# Patient Record
Sex: Female | Born: 1950 | Race: White | Hispanic: No | Marital: Married | Smoking: Never smoker
Health system: Southern US, Community
[De-identification: ages and names within clinical notes are randomized; demographics above are authoritative.]

## PROBLEM LIST (undated history)

## (undated) DIAGNOSIS — E7800 Pure hypercholesterolemia, unspecified: Secondary | ICD-10-CM

## (undated) DIAGNOSIS — IMO0002 Reserved for concepts with insufficient information to code with codable children: Secondary | ICD-10-CM

## (undated) HISTORY — DX: Reserved for concepts with insufficient information to code with codable children: IMO0002

## (undated) HISTORY — PX: OTHER SURGICAL HISTORY: SHX169

## (undated) HISTORY — DX: Pure hypercholesterolemia, unspecified: E78.00

## (undated) HISTORY — PX: CERVICAL CONE BIOPSY: SUR198

## (undated) HISTORY — PX: WISDOM TOOTH EXTRACTION: SHX21

---

## 1999-01-01 ENCOUNTER — Other Ambulatory Visit: Admission: RE | Admit: 1999-01-01 | Discharge: 1999-01-01 | Payer: Self-pay | Admitting: Obstetrics & Gynecology

## 2000-10-15 ENCOUNTER — Other Ambulatory Visit: Admission: RE | Admit: 2000-10-15 | Discharge: 2000-10-15 | Payer: Self-pay | Admitting: Obstetrics & Gynecology

## 2001-11-14 ENCOUNTER — Other Ambulatory Visit: Admission: RE | Admit: 2001-11-14 | Discharge: 2001-11-14 | Payer: Self-pay | Admitting: Obstetrics & Gynecology

## 2002-12-21 ENCOUNTER — Other Ambulatory Visit: Admission: RE | Admit: 2002-12-21 | Discharge: 2002-12-21 | Payer: Self-pay | Admitting: Obstetrics & Gynecology

## 2004-01-15 ENCOUNTER — Other Ambulatory Visit: Admission: RE | Admit: 2004-01-15 | Discharge: 2004-01-15 | Payer: Self-pay | Admitting: Obstetrics & Gynecology

## 2004-02-01 ENCOUNTER — Encounter (INDEPENDENT_AMBULATORY_CARE_PROVIDER_SITE_OTHER): Payer: Self-pay | Admitting: Specialist

## 2004-02-01 ENCOUNTER — Ambulatory Visit (HOSPITAL_COMMUNITY): Admission: RE | Admit: 2004-02-01 | Discharge: 2004-02-01 | Payer: Self-pay | Admitting: Gastroenterology

## 2005-08-30 DIAGNOSIS — R87619 Unspecified abnormal cytological findings in specimens from cervix uteri: Secondary | ICD-10-CM

## 2005-08-30 DIAGNOSIS — IMO0002 Reserved for concepts with insufficient information to code with codable children: Secondary | ICD-10-CM

## 2005-08-30 HISTORY — DX: Unspecified abnormal cytological findings in specimens from cervix uteri: R87.619

## 2005-08-30 HISTORY — DX: Reserved for concepts with insufficient information to code with codable children: IMO0002

## 2005-09-24 ENCOUNTER — Other Ambulatory Visit: Admission: RE | Admit: 2005-09-24 | Discharge: 2005-09-24 | Payer: Self-pay | Admitting: Obstetrics & Gynecology

## 2005-12-18 ENCOUNTER — Other Ambulatory Visit: Admission: RE | Admit: 2005-12-18 | Discharge: 2005-12-18 | Payer: Self-pay | Admitting: Obstetrics & Gynecology

## 2007-07-14 ENCOUNTER — Encounter: Admission: RE | Admit: 2007-07-14 | Discharge: 2007-07-14 | Payer: Self-pay | Admitting: Obstetrics & Gynecology

## 2010-12-21 ENCOUNTER — Encounter: Payer: Self-pay | Admitting: Obstetrics & Gynecology

## 2011-12-07 ENCOUNTER — Ambulatory Visit: Payer: Managed Care, Other (non HMO) | Admitting: Family Medicine

## 2011-12-07 DIAGNOSIS — M810 Age-related osteoporosis without current pathological fracture: Secondary | ICD-10-CM

## 2011-12-30 ENCOUNTER — Telehealth: Payer: Self-pay

## 2011-12-30 NOTE — Telephone Encounter (Signed)
Patient would like for Dr. Hal Hope to mail her a rx for Fosamox she states that she only has a rx for 4wks worth of medication and her insurance would like her to use the mail order to get her medications sent to her. Please contact her if any questions.

## 2011-12-31 MED ORDER — ALENDRONATE SODIUM 70 MG PO TABS: 70.0000 mg | ORAL_TABLET | ORAL | Status: DC

## 2011-12-31 NOTE — Telephone Encounter (Signed)
Done. Please mail to pat.  Cheyenne Haley

## 2012-01-01 NOTE — Telephone Encounter (Signed)
LMOM that request has been completed and mailed.

## 2012-01-06 ENCOUNTER — Other Ambulatory Visit: Payer: Self-pay | Admitting: Family Medicine

## 2012-02-16 ENCOUNTER — Telehealth: Payer: Self-pay

## 2012-02-16 MED ORDER — ALENDRONATE SODIUM 70 MG PO TABS: 70.0000 mg | ORAL_TABLET | ORAL | Status: AC

## 2012-02-16 NOTE — Telephone Encounter (Signed)
Please advise patient that new rx for 90-day supply was sent to [facility].  She should contact them to verify they received it!

## 2012-02-16 NOTE — Telephone Encounter (Signed)
alendronate (FOSAMAX) 70 MG tablet  Pt is requesting a 90 day supply from [facility].  [facility] stated the patient had to contact us as they could not submit the current script of 28 days as a 90 day refill [facility] [phone]  PT ID at [facility] IS [id number]  PT Spelled the medication as AINDRONATE SODIUM TABLES 70

## 2012-02-16 NOTE — Telephone Encounter (Signed)
Called patient. Left her a message about the new script going to [facility].

## 2012-03-15 ENCOUNTER — Encounter: Payer: PRIVATE HEALTH INSURANCE | Admitting: Obstetrics and Gynecology

## 2012-03-30 ENCOUNTER — Encounter: Payer: PRIVATE HEALTH INSURANCE | Admitting: Obstetrics and Gynecology

## 2012-04-13 ENCOUNTER — Encounter: Payer: PRIVATE HEALTH INSURANCE | Admitting: Obstetrics and Gynecology

## 2012-05-04 ENCOUNTER — Encounter: Payer: PRIVATE HEALTH INSURANCE | Admitting: Obstetrics and Gynecology

## 2012-06-06 ENCOUNTER — Encounter: Payer: Self-pay | Admitting: Obstetrics and Gynecology

## 2012-06-06 ENCOUNTER — Ambulatory Visit (INDEPENDENT_AMBULATORY_CARE_PROVIDER_SITE_OTHER): Payer: Commercial Indemnity | Admitting: Obstetrics and Gynecology

## 2012-06-06 VITALS — BP 120/78 | Ht 65.0 in | Wt 141.0 lb

## 2012-06-06 DIAGNOSIS — Z01419 Encounter for gynecological examination (general) (routine) without abnormal findings: Secondary | ICD-10-CM

## 2012-06-06 DIAGNOSIS — Z124 Encounter for screening for malignant neoplasm of cervix: Secondary | ICD-10-CM

## 2012-06-06 DIAGNOSIS — M8580 Other specified disorders of bone density and structure, unspecified site: Secondary | ICD-10-CM

## 2012-06-06 DIAGNOSIS — M899 Disorder of bone, unspecified: Secondary | ICD-10-CM

## 2012-06-06 NOTE — Patient Instructions (Signed)
Calcium requirements sheet

## 2012-06-06 NOTE — Progress Notes (Signed)
The patient is not taking hormone replacement therapy The patient  is taking a Calcium supplement. Post-menopausal bleeding:no  Last Pap: was normal February   2012 Last mammogram: was normal February  2012 Last DEXA scan : T= -2.0    September  2012 Using Fosamax 70 mg weekly for 4-5 years, took a 2 year break and restarted 02/2012 Last colonoscopy:Normal 2 years ago per pt   Urinary symptoms: none Normal bowel movements: Yes Reports abuse at home: No  Subjective:    Cheyenne Haley is a 61 y.o. female G0P0 who presents for annual exam. Referred by Dr Hal Hope The patient has no complaints today.   The following portions of the patient's history were reviewed and updated as appropriate: allergies, current medications, past family history, past medical history, past social history, past surgical history and problem list.  Review of Systems Pertinent items are noted in HPI. Gastrointestinal:No change in bowel habits, no abdominal pain, no rectal bleeding Genitourinary:negative for dysuria, frequency, hematuria, nocturia and urinary incontinence    Objective:     BP 120/78  Ht 5\' 5"  (1.651 m)  Wt 141 lb (63.957 kg)  BMI 23.46 kg/m2  Weight:  Wt Readings from Last 1 Encounters:  06/06/12 141 lb (63.957 kg)     BMI: Body mass index is 23.46 kg/(m^2). General Appearance: Alert, appropriate appearance for age. No acute distress HEENT: Grossly normal Neck / Thyroid: Supple, no masses, nodes or enlargement Lungs: clear to auscultation bilaterally Back: No CVA tenderness Breast Exam: No masses or nodes.No dimpling, nipple retraction or discharge. Cardiovascular: Regular rate and rhythm. S1, S2, no murmur Gastrointestinal: Soft, non-tender, no masses or organomegaly Pelvic Exam: Vulva and vagina appear normal. Bimanual exam reveals normal uterus and adnexa. Rectovaginal: normal rectal, no masses Lymphatic Exam: Non-palpable nodes in neck, clavicular, axillary, or inguinal regions  Skin: no rash or abnormalities Neurologic: Normal gait and speech, no tremor  Psychiatric: Alert and oriented, appropriate affect.         Assessment:    Normal gyn exam Osteopenia  Vaginal dryness   Plan:     Suggest D/C Fosamax, optimize Calcium to 1500 mg daily, check Vitamin D level, increase weight-bearing exercise to 3x weekly Pap Mammogram DEXA next year HRT reviewed with [facility] and [facility] Follow-up:  for annual exam

## 2012-06-07 LAB — PAP IG W/ RFLX HPV ASCU

## 2012-06-07 LAB — VITAMIN D 25 HYDROXY (VIT D DEFICIENCY, FRACTURES): Vit D, 25-Hydroxy: 40 ng/mL (ref 30–89)

## 2012-08-04 ENCOUNTER — Telehealth: Payer: Self-pay | Admitting: Obstetrics and Gynecology

## 2012-08-04 NOTE — Telephone Encounter (Signed)
LAURA/ RES

## 2012-08-04 NOTE — Telephone Encounter (Signed)
LM for pt that Vit D level was normal at 40 and Dr Dorothyann Peng was mentioned as PCP referral.  ld

## 2012-11-21 ENCOUNTER — Telehealth: Payer: Self-pay

## 2012-11-21 DIAGNOSIS — Z139 Encounter for screening, unspecified: Secondary | ICD-10-CM

## 2012-11-21 DIAGNOSIS — Z1231 Encounter for screening mammogram for malignant neoplasm of breast: Secondary | ICD-10-CM

## 2012-11-21 NOTE — Telephone Encounter (Signed)
VM from pt regarding mmg in our office advised pt that was not going to be until next year. Pt states that she is needing mmg. Advised pt of [facility] phone number to schedule and will put in order for mmg.   Darien Ramus, CMA

## 2012-12-22 ENCOUNTER — Telehealth: Payer: Self-pay | Admitting: Obstetrics and Gynecology

## 2012-12-22 NOTE — Telephone Encounter (Signed)
  TC to pt.  States Dr Lelon Perla is not accepting new pts.  Per HD, informed will need to research PCP who accepts insurance to locate MD.   Pt agreeable.

## 2012-12-29 ENCOUNTER — Ambulatory Visit
Admission: RE | Admit: 2012-12-29 | Discharge: 2012-12-29 | Disposition: A | Discharge status: Home / Self Care | Payer: Commercial Indemnity | Source: Ambulatory Visit | Attending: Obstetrics and Gynecology | Admitting: Obstetrics and Gynecology

## 2012-12-29 DIAGNOSIS — Z1231 Encounter for screening mammogram for malignant neoplasm of breast: Secondary | ICD-10-CM

## 2014-01-30 ENCOUNTER — Other Ambulatory Visit: Payer: Self-pay

## 2014-01-30 DIAGNOSIS — Z1231 Encounter for screening mammogram for malignant neoplasm of breast: Secondary | ICD-10-CM

## 2014-02-13 ENCOUNTER — Ambulatory Visit: Payer: Commercial Indemnity

## 2014-02-19 ENCOUNTER — Ambulatory Visit
Admission: RE | Admit: 2014-02-19 | Discharge: 2014-02-19 | Disposition: A | Discharge status: Home / Self Care | Payer: Self-pay | Source: Ambulatory Visit

## 2014-02-19 DIAGNOSIS — Z1231 Encounter for screening mammogram for malignant neoplasm of breast: Secondary | ICD-10-CM

## 2014-02-22 ENCOUNTER — Other Ambulatory Visit: Payer: Self-pay | Admitting: Obstetrics and Gynecology

## 2014-02-22 DIAGNOSIS — R928 Other abnormal and inconclusive findings on diagnostic imaging of breast: Secondary | ICD-10-CM

## 2014-02-28 ENCOUNTER — Ambulatory Visit
Admission: RE | Admit: 2014-02-28 | Discharge: 2014-02-28 | Disposition: A | Discharge status: Home / Self Care | Payer: Commercial Indemnity | Source: Ambulatory Visit | Attending: Obstetrics and Gynecology | Admitting: Obstetrics and Gynecology

## 2014-02-28 DIAGNOSIS — R928 Other abnormal and inconclusive findings on diagnostic imaging of breast: Secondary | ICD-10-CM

## 2015-04-03 ENCOUNTER — Other Ambulatory Visit: Payer: Self-pay

## 2015-04-03 DIAGNOSIS — Z1231 Encounter for screening mammogram for malignant neoplasm of breast: Secondary | ICD-10-CM

## 2015-04-16 ENCOUNTER — Ambulatory Visit
Admission: RE | Admit: 2015-04-16 | Discharge: 2015-04-16 | Disposition: A | Discharge status: Home / Self Care | Payer: BLUE CROSS/BLUE SHIELD | Source: Ambulatory Visit

## 2015-04-16 DIAGNOSIS — Z1231 Encounter for screening mammogram for malignant neoplasm of breast: Secondary | ICD-10-CM

## 2016-06-22 ENCOUNTER — Other Ambulatory Visit: Payer: Self-pay | Admitting: Obstetrics and Gynecology

## 2016-06-22 DIAGNOSIS — Z1231 Encounter for screening mammogram for malignant neoplasm of breast: Secondary | ICD-10-CM

## 2016-07-01 ENCOUNTER — Ambulatory Visit
Admission: RE | Admit: 2016-07-01 | Discharge: 2016-07-01 | Disposition: A | Discharge status: Home / Self Care | Payer: BLUE CROSS/BLUE SHIELD | Source: Ambulatory Visit | Attending: Obstetrics and Gynecology | Admitting: Obstetrics and Gynecology

## 2016-07-01 DIAGNOSIS — Z1231 Encounter for screening mammogram for malignant neoplasm of breast: Secondary | ICD-10-CM

## 2018-09-14 ENCOUNTER — Ambulatory Visit (INDEPENDENT_AMBULATORY_CARE_PROVIDER_SITE_OTHER): Payer: Medicare Other

## 2018-09-14 ENCOUNTER — Other Ambulatory Visit: Payer: Self-pay | Admitting: Podiatry

## 2018-09-14 ENCOUNTER — Ambulatory Visit: Payer: Medicare Other | Admitting: Podiatry

## 2018-09-14 ENCOUNTER — Encounter: Payer: Self-pay | Admitting: Podiatry

## 2018-09-14 VITALS — BP 122/73 | HR 73 | Resp 16

## 2018-09-14 DIAGNOSIS — M722 Plantar fascial fibromatosis: Secondary | ICD-10-CM

## 2018-09-14 DIAGNOSIS — M79671 Pain in right foot: Secondary | ICD-10-CM

## 2018-09-14 DIAGNOSIS — E663 Overweight: Secondary | ICD-10-CM | POA: Insufficient documentation

## 2018-09-14 MED ORDER — TRIAMCINOLONE ACETONIDE 10 MG/ML IJ SUSP
10.0000 mg | Freq: Once | INTRAMUSCULAR | Status: AC
  Administered 2018-09-14: 10 mg

## 2018-09-14 NOTE — Patient Instructions (Signed)

## 2018-09-14 NOTE — Progress Notes (Signed)
Subjective:   Patient ID: Cheyenne Haley, female   DOB: 67 y.o.   MRN: [id number]   HPI Patient presents stating the bottom my right heel is really been hurting me for the last month and I do not remember specific injury or why this made like this.  Patient is worse when she gets up in the morning after periods of sitting and patient does not smoke and likes to be active   Review of Systems  All other systems reviewed and are negative.       Objective:  Physical Exam  Constitutional: She appears well-developed and well-nourished.  Cardiovascular: Intact distal pulses.  Pulmonary/Chest: Effort normal.  Musculoskeletal: Normal range of motion.  Neurological: She is alert.  Skin: Skin is warm.  Nursing note and vitals reviewed.   Neurovascular status intact muscle strength is adequate range of motion within normal limits with patient found to have exquisite discomfort plantar aspect right heel at the insertional point of the tendon into the calcaneus with inflammation and fluid around the medial band.  Patient's found to have good digital perfusion and is found to be well oriented     Assessment:  Acute plantar fasciitis right with inflammation fluid of the medial band     Plan:  H&P x-ray reviewed and today I injected the plantar fascia 3 mg Kenalog 5 Milgram Xylocaine applied fascial brace with instructions on usage and instructed on supportive shoes and higher heeled type shoes.  Patient will be seen back to recheck in the next several weeks  X-ray indicates small spur with no indications of stress fracture or arthritis

## 2018-09-14 NOTE — Progress Notes (Signed)
   Subjective:    Patient ID: Cheyenne Haley, female    DOB: 13-Jan-1951, 67 y.o.   MRN: [id number]  HPI    Review of Systems  All other systems reviewed and are negative.      Objective:   Physical Exam        Assessment & Plan:

## 2019-01-20 ENCOUNTER — Telehealth: Payer: Self-pay | Admitting: *Deleted

## 2019-01-20 NOTE — Telephone Encounter (Signed)
Pt states she would like her medical records faxed to [facility], she saw Dr. Charlsie Merles in October and had x-rays.

## 2019-01-25 NOTE — Telephone Encounter (Signed)
I returned pt's phone call about her requesting her medical records be sent to [facility]. I told the pt she would need to fill out and sign a medical records release form. Pt requested the form be e-mailed to her at [email or web address].

## 2019-12-24 ENCOUNTER — Ambulatory Visit: Payer: Medicare Other | Attending: Internal Medicine

## 2019-12-24 DIAGNOSIS — Z23 Encounter for immunization: Secondary | ICD-10-CM

## 2019-12-24 NOTE — Progress Notes (Signed)
   Covid-19 Vaccination Clinic  Name:  Cheyenne Haley    MRN: [id number] DOB: 02/21/1951  12/24/2019  [name] was observed post Covid-19 immunization for 15 minutes without incidence. She was provided with Vaccine Information Sheet and instruction to access the V-Safe system.   [name] was instructed to call 911 with any severe reactions post vaccine: Marland Kitchen Difficulty breathing  . Swelling of your face and throat  . A fast heartbeat  . A bad rash all over your body  . Dizziness and weakness    Immunizations Administered    Name Date Dose VIS Date Route   Pfizer COVID-19 Vaccine 12/24/2019 11:02 AM 0.3 mL 11/10/2019 Intramuscular   Manufacturer: [employer], [employer]   Lot: [id number]   NDC: [address]-5

## 2020-01-15 ENCOUNTER — Ambulatory Visit: Payer: Medicare Other | Attending: Internal Medicine

## 2020-01-15 DIAGNOSIS — Z23 Encounter for immunization: Secondary | ICD-10-CM

## 2020-01-15 NOTE — Progress Notes (Signed)
   Covid-19 Vaccination Clinic  Name:  Cheyenne Haley    MRN: [id number] DOB: 20-Aug-1951  01/15/2020  [name] was observed post Covid-19 immunization for 15 minutes without incidence. She was provided with Vaccine Information Sheet and instruction to access the V-Safe system.   [name] was instructed to call 911 with any severe reactions post vaccine: Marland Kitchen Difficulty breathing  . Swelling of your face and throat  . A fast heartbeat  . A bad rash all over your body  . Dizziness and weakness    Immunizations Administered    Name Date Dose VIS Date Route   Pfizer COVID-19 Vaccine 01/15/2020 10:41 AM 0.3 mL 11/10/2019 Intramuscular   Manufacturer: [employer], [employer]   Lot: [id number]   NDC: [address]-3

## 2020-12-19 DIAGNOSIS — H524 Presbyopia: Secondary | ICD-10-CM | POA: Diagnosis not present

## 2020-12-19 DIAGNOSIS — H5203 Hypermetropia, bilateral: Secondary | ICD-10-CM | POA: Diagnosis not present

## 2020-12-19 DIAGNOSIS — H2513 Age-related nuclear cataract, bilateral: Secondary | ICD-10-CM | POA: Diagnosis not present

## 2021-07-04 DIAGNOSIS — E559 Vitamin D deficiency, unspecified: Secondary | ICD-10-CM | POA: Diagnosis not present

## 2021-07-04 DIAGNOSIS — G4762 Sleep related leg cramps: Secondary | ICD-10-CM | POA: Diagnosis not present

## 2021-07-04 DIAGNOSIS — E7800 Pure hypercholesterolemia, unspecified: Secondary | ICD-10-CM | POA: Diagnosis not present

## 2021-07-04 DIAGNOSIS — Z0000 Encounter for general adult medical examination without abnormal findings: Secondary | ICD-10-CM | POA: Diagnosis not present

## 2021-07-04 DIAGNOSIS — M5416 Radiculopathy, lumbar region: Secondary | ICD-10-CM | POA: Diagnosis not present

## 2021-07-04 DIAGNOSIS — Z1211 Encounter for screening for malignant neoplasm of colon: Secondary | ICD-10-CM | POA: Diagnosis not present

## 2021-11-18 DIAGNOSIS — M810 Age-related osteoporosis without current pathological fracture: Secondary | ICD-10-CM | POA: Diagnosis not present

## 2021-11-18 DIAGNOSIS — M5432 Sciatica, left side: Secondary | ICD-10-CM | POA: Diagnosis not present

## 2021-11-18 DIAGNOSIS — Z1231 Encounter for screening mammogram for malignant neoplasm of breast: Secondary | ICD-10-CM | POA: Diagnosis not present

## 2021-11-18 DIAGNOSIS — Z6823 Body mass index (BMI) 23.0-23.9, adult: Secondary | ICD-10-CM | POA: Diagnosis not present

## 2021-12-03 ENCOUNTER — Other Ambulatory Visit: Payer: Self-pay | Admitting: Obstetrics

## 2021-12-03 DIAGNOSIS — M810 Age-related osteoporosis without current pathological fracture: Secondary | ICD-10-CM

## 2022-04-30 ENCOUNTER — Ambulatory Visit
Admission: RE | Admit: 2022-04-30 | Discharge: 2022-04-30 | Disposition: A | Discharge status: Home / Self Care | Payer: Medicare Other | Source: Ambulatory Visit | Attending: Obstetrics | Admitting: Obstetrics

## 2022-04-30 DIAGNOSIS — Z780 Asymptomatic menopausal state: Secondary | ICD-10-CM | POA: Diagnosis not present

## 2022-04-30 DIAGNOSIS — M810 Age-related osteoporosis without current pathological fracture: Secondary | ICD-10-CM

## 2022-04-30 DIAGNOSIS — M8588 Other specified disorders of bone density and structure, other site: Secondary | ICD-10-CM | POA: Diagnosis not present

## 2022-07-17 DIAGNOSIS — E559 Vitamin D deficiency, unspecified: Secondary | ICD-10-CM | POA: Diagnosis not present

## 2022-07-17 DIAGNOSIS — Z1211 Encounter for screening for malignant neoplasm of colon: Secondary | ICD-10-CM | POA: Diagnosis not present

## 2022-07-17 DIAGNOSIS — G4762 Sleep related leg cramps: Secondary | ICD-10-CM | POA: Diagnosis not present

## 2022-07-17 DIAGNOSIS — M5416 Radiculopathy, lumbar region: Secondary | ICD-10-CM | POA: Diagnosis not present

## 2022-07-17 DIAGNOSIS — E7800 Pure hypercholesterolemia, unspecified: Secondary | ICD-10-CM | POA: Diagnosis not present

## 2022-07-17 DIAGNOSIS — Z0000 Encounter for general adult medical examination without abnormal findings: Secondary | ICD-10-CM | POA: Diagnosis not present

## 2022-07-24 ENCOUNTER — Other Ambulatory Visit: Payer: Self-pay | Admitting: Internal Medicine

## 2022-07-24 DIAGNOSIS — E785 Hyperlipidemia, unspecified: Secondary | ICD-10-CM

## 2022-07-29 ENCOUNTER — Ambulatory Visit
Admission: RE | Admit: 2022-07-29 | Discharge: 2022-07-29 | Disposition: A | Discharge status: Home / Self Care | Payer: Medicare Other | Source: Ambulatory Visit | Attending: Internal Medicine | Admitting: Internal Medicine

## 2022-07-29 DIAGNOSIS — E785 Hyperlipidemia, unspecified: Secondary | ICD-10-CM

## 2022-10-13 ENCOUNTER — Other Ambulatory Visit: Payer: Medicare Other

## 2023-02-04 DIAGNOSIS — Z1231 Encounter for screening mammogram for malignant neoplasm of breast: Secondary | ICD-10-CM | POA: Diagnosis not present

## 2023-02-04 DIAGNOSIS — M810 Age-related osteoporosis without current pathological fracture: Secondary | ICD-10-CM | POA: Diagnosis not present

## 2023-02-04 DIAGNOSIS — Z6824 Body mass index (BMI) 24.0-24.9, adult: Secondary | ICD-10-CM | POA: Diagnosis not present

## 2023-03-17 DIAGNOSIS — H5203 Hypermetropia, bilateral: Secondary | ICD-10-CM | POA: Diagnosis not present

## 2023-03-17 DIAGNOSIS — H2513 Age-related nuclear cataract, bilateral: Secondary | ICD-10-CM | POA: Diagnosis not present

## 2023-03-17 DIAGNOSIS — H524 Presbyopia: Secondary | ICD-10-CM | POA: Diagnosis not present

## 2023-07-22 DIAGNOSIS — L84 Corns and callosities: Secondary | ICD-10-CM | POA: Diagnosis not present

## 2023-07-22 DIAGNOSIS — G4762 Sleep related leg cramps: Secondary | ICD-10-CM | POA: Diagnosis not present

## 2023-07-22 DIAGNOSIS — M859 Disorder of bone density and structure, unspecified: Secondary | ICD-10-CM | POA: Diagnosis not present

## 2023-07-22 DIAGNOSIS — Z0000 Encounter for general adult medical examination without abnormal findings: Secondary | ICD-10-CM | POA: Diagnosis not present

## 2023-07-22 DIAGNOSIS — E559 Vitamin D deficiency, unspecified: Secondary | ICD-10-CM | POA: Diagnosis not present

## 2023-07-22 DIAGNOSIS — E7800 Pure hypercholesterolemia, unspecified: Secondary | ICD-10-CM | POA: Diagnosis not present

## 2023-07-22 DIAGNOSIS — K635 Polyp of colon: Secondary | ICD-10-CM | POA: Diagnosis not present

## 2023-07-22 DIAGNOSIS — K5730 Diverticulosis of large intestine without perforation or abscess without bleeding: Secondary | ICD-10-CM | POA: Diagnosis not present

## 2023-10-27 DIAGNOSIS — K5730 Diverticulosis of large intestine without perforation or abscess without bleeding: Secondary | ICD-10-CM | POA: Diagnosis not present

## 2023-10-27 DIAGNOSIS — D122 Benign neoplasm of ascending colon: Secondary | ICD-10-CM | POA: Diagnosis not present

## 2023-10-27 DIAGNOSIS — Z09 Encounter for follow-up examination after completed treatment for conditions other than malignant neoplasm: Secondary | ICD-10-CM | POA: Diagnosis not present

## 2023-10-27 DIAGNOSIS — Z860101 Personal history of adenomatous and serrated colon polyps: Secondary | ICD-10-CM | POA: Diagnosis not present

## 2023-11-03 DIAGNOSIS — D122 Benign neoplasm of ascending colon: Secondary | ICD-10-CM | POA: Diagnosis not present

## 2024-03-20 DIAGNOSIS — H2513 Age-related nuclear cataract, bilateral: Secondary | ICD-10-CM | POA: Diagnosis not present

## 2024-03-20 DIAGNOSIS — H5203 Hypermetropia, bilateral: Secondary | ICD-10-CM | POA: Diagnosis not present

## 2024-03-20 DIAGNOSIS — H524 Presbyopia: Secondary | ICD-10-CM | POA: Diagnosis not present

## 2024-04-26 DIAGNOSIS — Z1231 Encounter for screening mammogram for malignant neoplasm of breast: Secondary | ICD-10-CM | POA: Diagnosis not present

## 2024-04-28 DIAGNOSIS — R6889 Other general symptoms and signs: Secondary | ICD-10-CM | POA: Diagnosis not present

## 2024-04-28 DIAGNOSIS — R2689 Other abnormalities of gait and mobility: Secondary | ICD-10-CM | POA: Diagnosis not present

## 2024-05-11 ENCOUNTER — Other Ambulatory Visit (HOSPITAL_BASED_OUTPATIENT_CLINIC_OR_DEPARTMENT_OTHER): Payer: Self-pay | Admitting: Obstetrics

## 2024-05-11 DIAGNOSIS — M810 Age-related osteoporosis without current pathological fracture: Secondary | ICD-10-CM
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
## 2024-06-01 ENCOUNTER — Other Ambulatory Visit: Payer: Self-pay

## 2024-06-01 ENCOUNTER — Telehealth: Payer: Self-pay | Admitting: Physical Therapy

## 2024-06-01 ENCOUNTER — Ambulatory Visit: Attending: Internal Medicine | Admitting: Physical Therapy

## 2024-06-01 ENCOUNTER — Encounter: Payer: Self-pay | Admitting: Physical Therapy

## 2024-06-01 DIAGNOSIS — R2689 Other abnormalities of gait and mobility: Secondary | ICD-10-CM | POA: Diagnosis not present

## 2024-06-01 DIAGNOSIS — R2681 Unsteadiness on feet: Secondary | ICD-10-CM | POA: Insufficient documentation

## 2024-06-01 DIAGNOSIS — R42 Dizziness and giddiness: Secondary | ICD-10-CM | POA: Diagnosis not present
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
## 2024-06-05 ENCOUNTER — Other Ambulatory Visit: Payer: Self-pay | Admitting: Internal Medicine

## 2024-06-05 ENCOUNTER — Encounter: Payer: Self-pay | Admitting: Neurology

## 2024-06-05 DIAGNOSIS — R42 Dizziness and giddiness: Secondary | ICD-10-CM
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
## 2024-06-08 ENCOUNTER — Encounter: Payer: Self-pay | Admitting: Physical Therapy

## 2024-06-08 ENCOUNTER — Ambulatory Visit: Admitting: Physical Therapy

## 2024-06-08 DIAGNOSIS — R2689 Other abnormalities of gait and mobility: Secondary | ICD-10-CM | POA: Diagnosis not present

## 2024-06-08 DIAGNOSIS — R42 Dizziness and giddiness: Secondary | ICD-10-CM

## 2024-06-08 DIAGNOSIS — R2681 Unsteadiness on feet: Secondary | ICD-10-CM | POA: Diagnosis not present

## 2024-06-09 ENCOUNTER — Ambulatory Visit
Admission: RE | Admit: 2024-06-09 | Discharge: 2024-06-09 | Disposition: A | Discharge status: Home / Self Care | Source: Ambulatory Visit | Attending: Internal Medicine

## 2024-06-09 ENCOUNTER — Ambulatory Visit

## 2024-06-09 DIAGNOSIS — R42 Dizziness and giddiness: Secondary | ICD-10-CM | POA: Diagnosis not present

## 2024-06-10 ENCOUNTER — Other Ambulatory Visit
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
## 2024-06-14 ENCOUNTER — Ambulatory Visit: Admitting: Physical Therapy

## 2024-06-14 ENCOUNTER — Encounter: Payer: Self-pay | Admitting: Physical Therapy

## 2024-06-14 DIAGNOSIS — R2681 Unsteadiness on feet: Secondary | ICD-10-CM

## 2024-06-14 DIAGNOSIS — R2689 Other abnormalities of gait and mobility: Secondary | ICD-10-CM

## 2024-06-14 DIAGNOSIS — R42 Dizziness and giddiness: Secondary | ICD-10-CM | POA: Diagnosis not present

## 2024-06-20 DIAGNOSIS — R2689 Other abnormalities of gait and mobility: Secondary | ICD-10-CM | POA: Diagnosis not present

## 2024-06-20 DIAGNOSIS — R930 Abnormal findings on diagnostic imaging of skull and head, not elsewhere classified: Secondary | ICD-10-CM | POA: Diagnosis not present

## 2024-06-20 DIAGNOSIS — Z7689 Persons encountering health services in other specified circumstances: Secondary | ICD-10-CM | POA: Diagnosis not present

## 2024-06-20 DIAGNOSIS — M8589 Other specified disorders of bone density and structure, multiple sites: Secondary | ICD-10-CM | POA: Diagnosis not present
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
## 2024-06-22 ENCOUNTER — Ambulatory Visit: Admitting: Physical Therapy

## 2024-06-22 ENCOUNTER — Encounter: Payer: Self-pay | Admitting: Physical Therapy

## 2024-06-22 DIAGNOSIS — R2689 Other abnormalities of gait and mobility: Secondary | ICD-10-CM

## 2024-06-22 DIAGNOSIS — R42 Dizziness and giddiness: Secondary | ICD-10-CM

## 2024-06-22 DIAGNOSIS — R2681 Unsteadiness on feet: Secondary | ICD-10-CM

## 2024-06-29 ENCOUNTER — Ambulatory Visit

## 2024-07-06 DIAGNOSIS — H5500 Unspecified nystagmus: Secondary | ICD-10-CM | POA: Diagnosis not present

## 2024-07-06 DIAGNOSIS — R2689 Other abnormalities of gait and mobility: Secondary | ICD-10-CM | POA: Diagnosis not present

## 2024-07-06 DIAGNOSIS — R9089 Other abnormal findings on diagnostic imaging of central nervous system: Secondary | ICD-10-CM | POA: Diagnosis not present

## 2024-07-06 DIAGNOSIS — E7800 Pure hypercholesterolemia, unspecified: Secondary | ICD-10-CM | POA: Diagnosis not present

## 2024-07-19 ENCOUNTER — Ambulatory Visit (INDEPENDENT_AMBULATORY_CARE_PROVIDER_SITE_OTHER)

## 2024-07-19 DIAGNOSIS — Z780 Asymptomatic menopausal state: Secondary | ICD-10-CM | POA: Diagnosis not present

## 2024-07-19 DIAGNOSIS — M810 Age-related osteoporosis without current pathological fracture: Secondary | ICD-10-CM

## 2024-07-25 ENCOUNTER — Other Ambulatory Visit

## 2024-07-25 ENCOUNTER — Ambulatory Visit: Admitting: Neurology

## 2024-07-25 ENCOUNTER — Encounter: Payer: Self-pay | Admitting: Neurology

## 2024-07-25 VITALS — Resp 20 | Ht 65.0 in | Wt 141.4 lb

## 2024-07-25 DIAGNOSIS — R2689 Other abnormalities of gait and mobility: Secondary | ICD-10-CM | POA: Diagnosis not present

## 2024-07-25 DIAGNOSIS — R519 Headache, unspecified: Secondary | ICD-10-CM | POA: Diagnosis not present

## 2024-07-25 DIAGNOSIS — Z0001 Encounter for general adult medical examination with abnormal findings: Secondary | ICD-10-CM | POA: Diagnosis not present

## 2024-07-25 DIAGNOSIS — G629 Polyneuropathy, unspecified: Secondary | ICD-10-CM | POA: Diagnosis not present

## 2024-07-25 DIAGNOSIS — Z131 Encounter for screening for diabetes mellitus: Secondary | ICD-10-CM | POA: Diagnosis not present

## 2024-07-25 DIAGNOSIS — Z136 Encounter for screening for cardiovascular disorders: Secondary | ICD-10-CM | POA: Diagnosis not present

## 2024-07-25 NOTE — Patient Instructions (Addendum)
 Good to meet you!  Have bloodwork done for B1, ESR, CRP, SPEP, IFE, folate, [address]NA [address]  Schedule MRA head without contrast and MRA neck with and without contrast at [facility] [phone]  3. Schedule nerve and muscle test (EMG/NCV) of both lower extremities  4. Your homework:  - start monitoring blood pressure when you are symptomatic and also when feeling fine in the evenings. - start doing home balance exercises  5. Continue to monitor cholesterol, glucose with PCP  6. Follow-up in 3 months, call for any changes
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
## 2024-07-28 DIAGNOSIS — Z0000 Encounter for general adult medical examination without abnormal findings: Secondary | ICD-10-CM | POA: Diagnosis not present

## 2024-07-28 DIAGNOSIS — K5730 Diverticulosis of large intestine without perforation or abscess without bleeding: Secondary | ICD-10-CM | POA: Diagnosis not present

## 2024-07-28 DIAGNOSIS — M859 Disorder of bone density and structure, unspecified: Secondary | ICD-10-CM | POA: Diagnosis not present

## 2024-07-28 DIAGNOSIS — R931 Abnormal findings on diagnostic imaging of heart and coronary circulation: Secondary | ICD-10-CM | POA: Diagnosis not present

## 2024-07-28 DIAGNOSIS — Z1159 Encounter for screening for other viral diseases: Secondary | ICD-10-CM | POA: Diagnosis not present

## 2024-07-28 DIAGNOSIS — R2689 Other abnormalities of gait and mobility: Secondary | ICD-10-CM | POA: Diagnosis not present

## 2024-07-28 DIAGNOSIS — E7800 Pure hypercholesterolemia, unspecified: Secondary | ICD-10-CM | POA: Diagnosis not present

## 2024-07-28 DIAGNOSIS — E559 Vitamin D deficiency, unspecified: Secondary | ICD-10-CM | POA: Diagnosis not present

## 2024-07-28 DIAGNOSIS — R9089 Other abnormal findings on diagnostic imaging of central nervous system: Secondary | ICD-10-CM | POA: Diagnosis not present

## 2024-07-29 ENCOUNTER — Encounter: Payer: Self-pay | Admitting: Neurology

## 2024-07-29 LAB — PROTEIN ELECTROPHORESIS, SERUM
Albumin ELP: 4.3 g/dL (ref 3.8–4.8)
Alpha 1: 0.3 g/dL (ref 0.2–0.3)
Alpha 2: 0.6 g/dL (ref 0.5–0.9)
Beta 2: 0.4 g/dL (ref 0.2–0.5)
Beta Globulin: 0.4 g/dL (ref 0.4–0.6)
Gamma Globulin: 0.8 g/dL (ref 0.8–1.7)
Total Protein: 6.8 g/dL (ref 6.1–8.1)

## 2024-07-29 LAB — IMMUNOFIXATION ELECTROPHORESIS
IgG (Immunoglobin G), Serum: 873 mg/dL (ref 600–1540)
IgM, Serum: 39 mg/dL — ABNORMAL LOW (ref 50–300)
Immunoglobulin A: 323 mg/dL — ABNORMAL HIGH (ref 70–320)

## 2024-07-29 LAB — VITAMIN B1: Vitamin B1 (Thiamine): 19 nmol/L (ref 8–30)

## 2024-07-29 LAB — SEDIMENTATION RATE: Sed Rate: 2 mm/h (ref 0–30)

## 2024-07-29 LAB — C-REACTIVE PROTEIN: CRP: <3 mg/L (ref <8.0)

## 2024-07-29 LAB — FOLATE: Folate: 20.7 ng/mL

## 2024-07-29 LAB — ANA: Anti Nuclear Antibody (ANA): NEGATIVE

## 2024-07-30 ENCOUNTER — Encounter: Payer: Self-pay | Admitting: Neurology

## 2024-08-04 DIAGNOSIS — M810 Age-related osteoporosis without current pathological fracture: Secondary | ICD-10-CM | POA: Diagnosis not present

## 2024-08-07 ENCOUNTER — Ambulatory Visit
Admission: RE | Admit: 2024-08-07 | Discharge: 2024-08-07 | Disposition: A | Discharge status: Home / Self Care | Source: Ambulatory Visit | Attending: Neurology | Admitting: Neurology

## 2024-08-07 DIAGNOSIS — G501 Atypical facial pain: Secondary | ICD-10-CM | POA: Diagnosis not present

## 2024-08-07 MED ORDER — GADOPICLENOL 0.5 MMOL/ML IV SOLN
6.5000 mL | Freq: Once | INTRAVENOUS | Status: AC | PRN
  Administered 2024-08-07: 6.5 mL via INTRAVENOUS

## 2024-08-10 ENCOUNTER — Ambulatory Visit: Admitting: Neurology

## 2024-08-14 ENCOUNTER — Ambulatory Visit: Payer: Self-pay | Admitting: Neurology

## 2024-10-03 ENCOUNTER — Ambulatory Visit: Admitting: Neurology

## 2024-10-03 DIAGNOSIS — G629 Polyneuropathy, unspecified: Secondary | ICD-10-CM

## 2024-10-03 DIAGNOSIS — R2689 Other abnormalities of gait and mobility: Secondary | ICD-10-CM | POA: Diagnosis not present

## 2024-10-03 DIAGNOSIS — R519 Headache, unspecified: Secondary | ICD-10-CM

## 2024-10-03 NOTE — Procedures (Signed)
 [facility]  [address] [place], [address]  [place], [place] [address] Tel: [phone] Fax: [phone] Test Date:  10/03/2024  Patient: Cheyenne Haley DOB: 1951/06/18 Physician: Venetia Potters, MD  Sex: Female Height: 5' 5 Ref Phys: Darice Shivers, MD  ID#: [id number]   Technician:    History: This is a 73 year old female with tingling and weakness in her legs.  NCV & EMG Findings: Extensive electrodiagnostic evaluation of bilateral lower limbs shows: Bilateral sural and superficial peroneal/fibular sensory responses are within normal limits. Bilateral peroneal/fibular (EDB) and tibial (AH) motor responses are within normal limits. Bilateral H reflex latency is within normal limits. There is no evidence of active or chronic motor axon loss changes affecting any of the tested muscles. Motor unit configuration and recruitment pattern is within normal limits.  Impression: This is a normal study of bilateral lower limbs. In particular, there is no electrodiagnostic evidence of a right or left lumbosacral (L3-S1) radiculopathy, large fiber sensorimotor neuropathy, or myopathy.    ___________________________ Venetia Potters, MD    Nerve Conduction Studies Motor Nerve Results    Latency Amplitude F-Lat Segment Distance CV Comment  Site (ms) Norm (mV) Norm (ms)  (cm) (m/s) Norm   Left Fibular (EDB) Motor  Ankle 3.8  < 6.0 2.8  > 2.5        Bel fib head 10.5 - 2.1 -  Bel fib head-Ankle 29 43  > 40   Pop fossa 12.2 - 1.92 -  Pop fossa-Bel fib head 8 47 -   Right Fibular (EDB) Motor  Ankle 3.7  < 6.0 3.0  > 2.5        Bel fib head 9.9 - 2.4 -  Bel fib head-Ankle 29.5 48  > 40   Pop fossa 11.7 - 2.3 -  Pop fossa-Bel fib head 9 50 -   Left Tibial (AH) Motor  Ankle 4.9  < 6.0 7.3  > 4.0        Knee 13.5 - 5.9 -  Knee-Ankle 39 45  > 40   Right Tibial (AH) Motor  Ankle 3.9  < 6.0 8.4  > 4.0        Knee 11.6 - 5.2 -  Knee-Ankle 40 52  > 40    Sensory Sites    Neg Peak Lat  Amplitude (O-P) Segment Distance Velocity Comment  Site (ms) Norm (V) Norm  (cm) (ms)   Left Superficial Fibular Sensory  14 cm-Ankle 2.6  < 4.6 3  > 3 14 cm-Ankle 14    Right Superficial Fibular Sensory  14 cm-Ankle 2.7  < 4.6 5  > 3 14 cm-Ankle 14    Left Sural Sensory  Calf-Lat mall 4.1  < 4.6 8  > 3 Calf-Lat mall 14    Right Sural Sensory  Calf-Lat mall 4.0  < 4.6 7  > 3 Calf-Lat mall 14     H-Reflex Results    M-Lat H Lat H Neg Amp H-M Lat  Site (ms) (ms) Norm (mV) (ms)  Left Tibial H-Reflex  Pop fossa 5.6 25.7  < 35.0 1.54 20.1  Right Tibial H-Reflex  Pop fossa 5.0 29.8  < 35.0 2.2 24.8   Electromyography   Side Muscle Ins.Act Fibs Fasc Recrt Amp Dur Poly Activation Comment  Left Tib ant Nml Nml Nml Nml Nml Nml Nml Nml N/A  Left Gastroc MH Nml Nml Nml Nml Nml Nml Nml Nml N/A  Left FDL Nml Nml Nml Nml Nml Nml  Nml Nml N/A  Left Rectus fem Nml Nml Nml Nml Nml Nml Nml Nml N/A  Left Biceps fem SH Nml Nml Nml Nml Nml Nml Nml Nml N/A  Left Gluteus med Nml Nml Nml Nml Nml Nml Nml Nml N/A  Right Tib ant Nml Nml Nml Nml Nml Nml Nml Nml N/A  Right Gastroc MH Nml Nml Nml Nml Nml Nml Nml Nml N/A  Right Rectus fem Nml Nml Nml Nml Nml Nml Nml Nml N/A  Right Biceps fem SH Nml Nml Nml Nml Nml Nml Nml Nml N/A  Right Gluteus med Nml Nml Nml Nml Nml Nml Nml Nml N/A      Waveforms:  Motor           Sensory           H-Reflex

## 2024-11-07 ENCOUNTER — Ambulatory Visit: Admitting: Neurology

## 2024-11-07 ENCOUNTER — Encounter: Payer: Self-pay | Admitting: Neurology

## 2024-11-07 VITALS — BP 119/78 | HR 75 | Ht 65.0 in | Wt 145.6 lb

## 2024-11-07 DIAGNOSIS — H8190 Unspecified disorder of vestibular function, unspecified ear: Secondary | ICD-10-CM

## 2024-11-07 DIAGNOSIS — R2689 Other abnormalities of gait and mobility: Secondary | ICD-10-CM

## 2024-11-07 NOTE — Patient Instructions (Signed)
 Good to see you doing better! Follow-up as needed, call for any changes.
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
